# Patient Record
Sex: Male | Born: 1995 | Race: White | Hispanic: No | Marital: Single | Smoking: Current some day smoker
Health system: Southern US, Community
[De-identification: ages and names within clinical notes are randomized; demographics above are authoritative.]

## PROBLEM LIST (undated history)

## (undated) DIAGNOSIS — F909 Attention-deficit hyperactivity disorder, unspecified type: Secondary | ICD-10-CM

## (undated) HISTORY — PX: FRACTURE SURGERY: SHX138

## (undated) HISTORY — PX: TONSILLECTOMY: SUR1361

## (undated) HISTORY — PX: CHOLECYSTECTOMY: SHX55

---
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
## 2018-09-01 ENCOUNTER — Emergency Department
Admission: EM | Admit: 2018-09-01 | Discharge: 2018-09-01 | Disposition: A | Discharge status: Home / Self Care | Payer: Self-pay | Source: Home / Self Care

## 2018-09-01 ENCOUNTER — Encounter: Payer: Self-pay | Admitting: Emergency Medicine

## 2018-09-01 ENCOUNTER — Emergency Department (INDEPENDENT_AMBULATORY_CARE_PROVIDER_SITE_OTHER): Payer: Self-pay

## 2018-09-01 ENCOUNTER — Other Ambulatory Visit: Payer: Self-pay

## 2018-09-01 DIAGNOSIS — J209 Acute bronchitis, unspecified: Secondary | ICD-10-CM

## 2018-09-01 DIAGNOSIS — R05 Cough: Secondary | ICD-10-CM

## 2018-09-01 DIAGNOSIS — J45901 Unspecified asthma with (acute) exacerbation: Secondary | ICD-10-CM

## 2018-09-01 MED ORDER — METHYLPREDNISOLONE ACETATE 80 MG/ML IJ SUSP
80.0000 mg | Freq: Once | INTRAMUSCULAR | Status: AC
  Administered 2018-09-01: 80 mg via INTRAMUSCULAR

## 2018-09-01 MED ORDER — ALBUTEROL SULFATE HFA 108 (90 BASE) MCG/ACT IN AERS: 1.0000 | INHALATION_SPRAY | Freq: Four times a day (QID) | RESPIRATORY_TRACT | 0 refills | Status: AC | PRN

## 2018-09-01 MED ORDER — PREDNISONE 50 MG PO TABS: 50.0000 mg | ORAL_TABLET | Freq: Every day | ORAL | 0 refills | Status: AC

## 2018-09-01 MED ORDER — IPRATROPIUM-ALBUTEROL 0.5-2.5 (3) MG/3ML IN SOLN
3.0000 mL | Freq: Once | RESPIRATORY_TRACT | Status: AC
  Administered 2018-09-01: 3 mL via RESPIRATORY_TRACT

## 2018-09-01 NOTE — ED Triage Notes (Signed)
Patient reports cough with some shortness of breath over past 2 days; no fever, no congestion; states it feels like when he had bronchitis a couple years ago. No OTC in past 4 hours.
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
## 2018-09-01 NOTE — Discharge Instructions (Signed)
°  Please schedule a follow up appointment with family medicine in 1 week if not improving.

## 2019-05-26 IMAGING — DX DG CHEST 2V
2 series · 2 of 2 positions shown · non-contrast
Comparison: 07/19/2016

CLINICAL DATA: Pt c/o cough, congestion and chest tightness x 2
days.

EXAM:
CHEST - 2 VIEW

[chest pa]
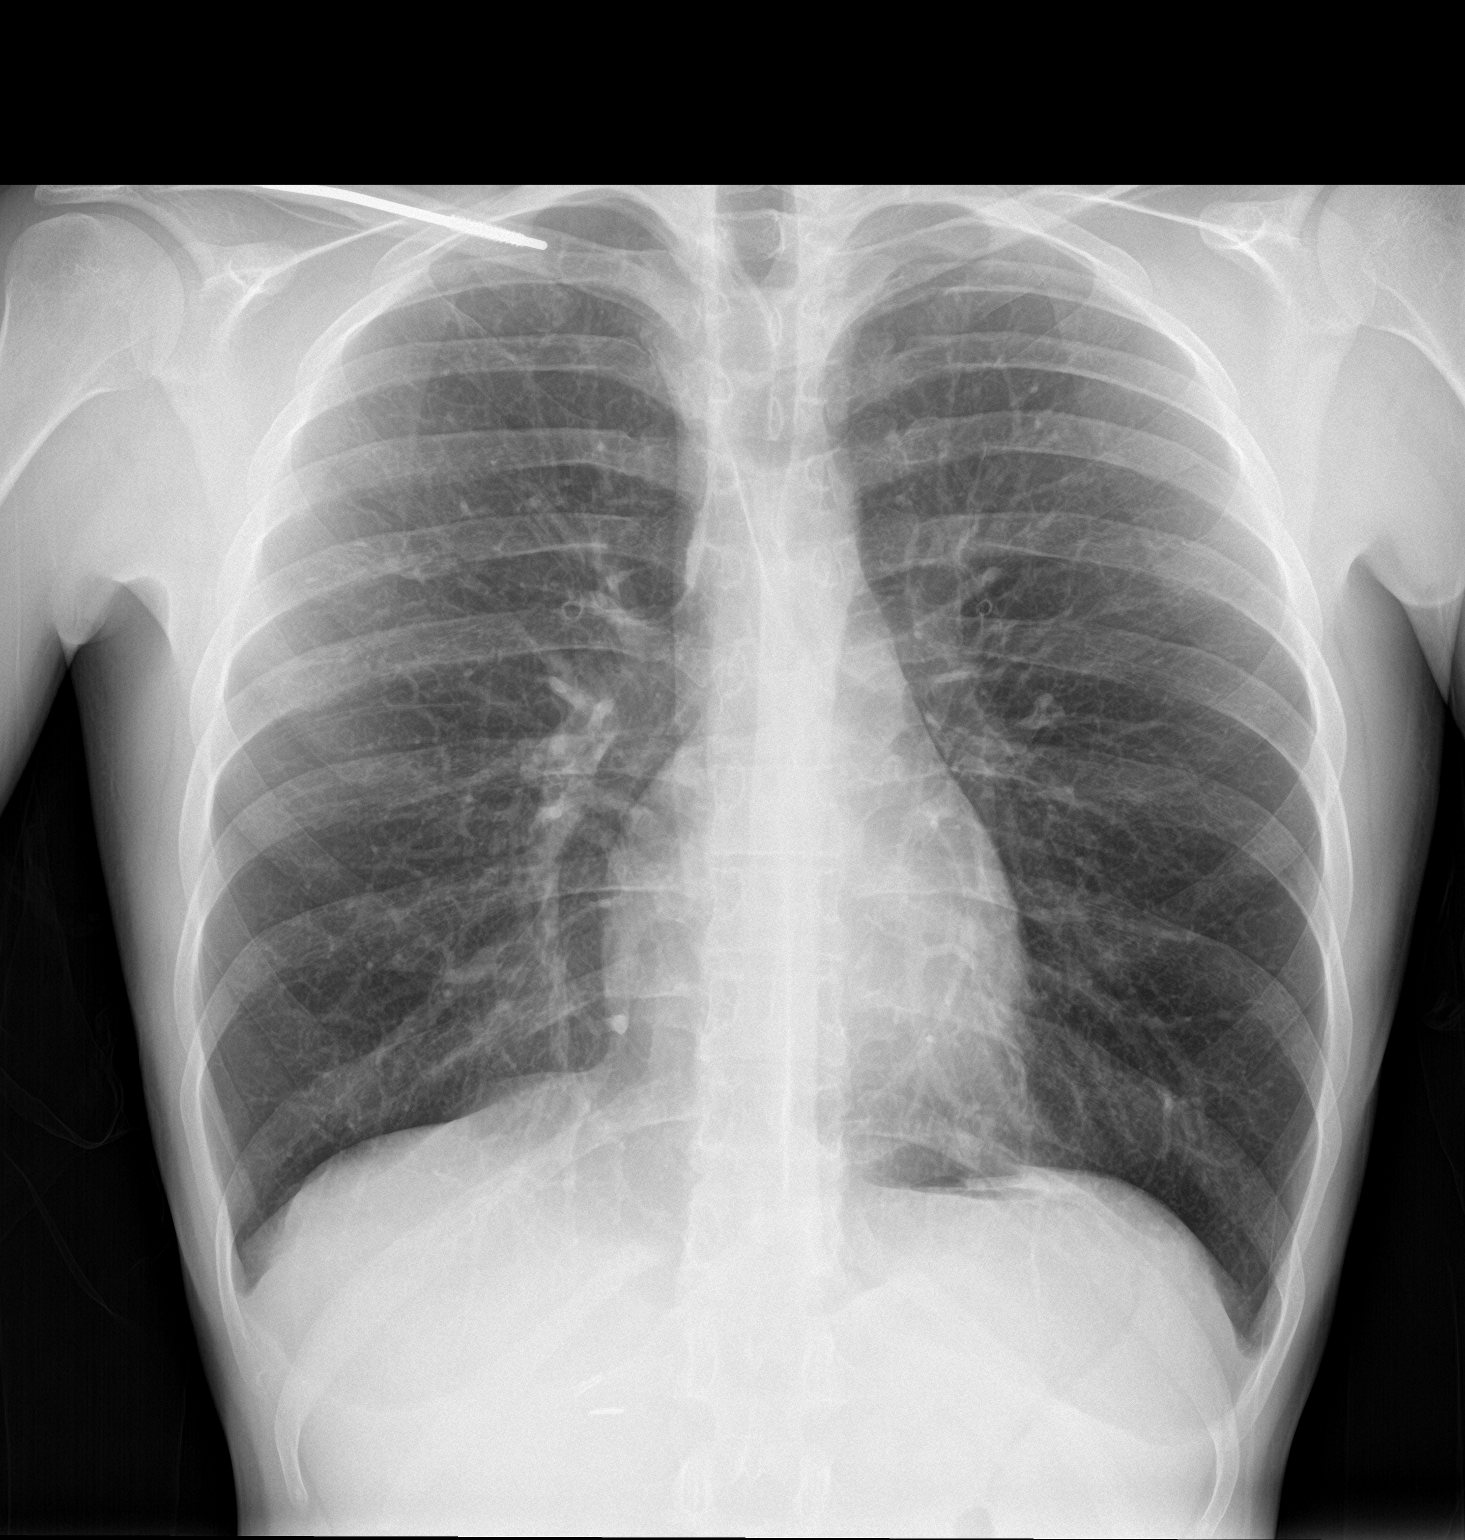

[chest lat]
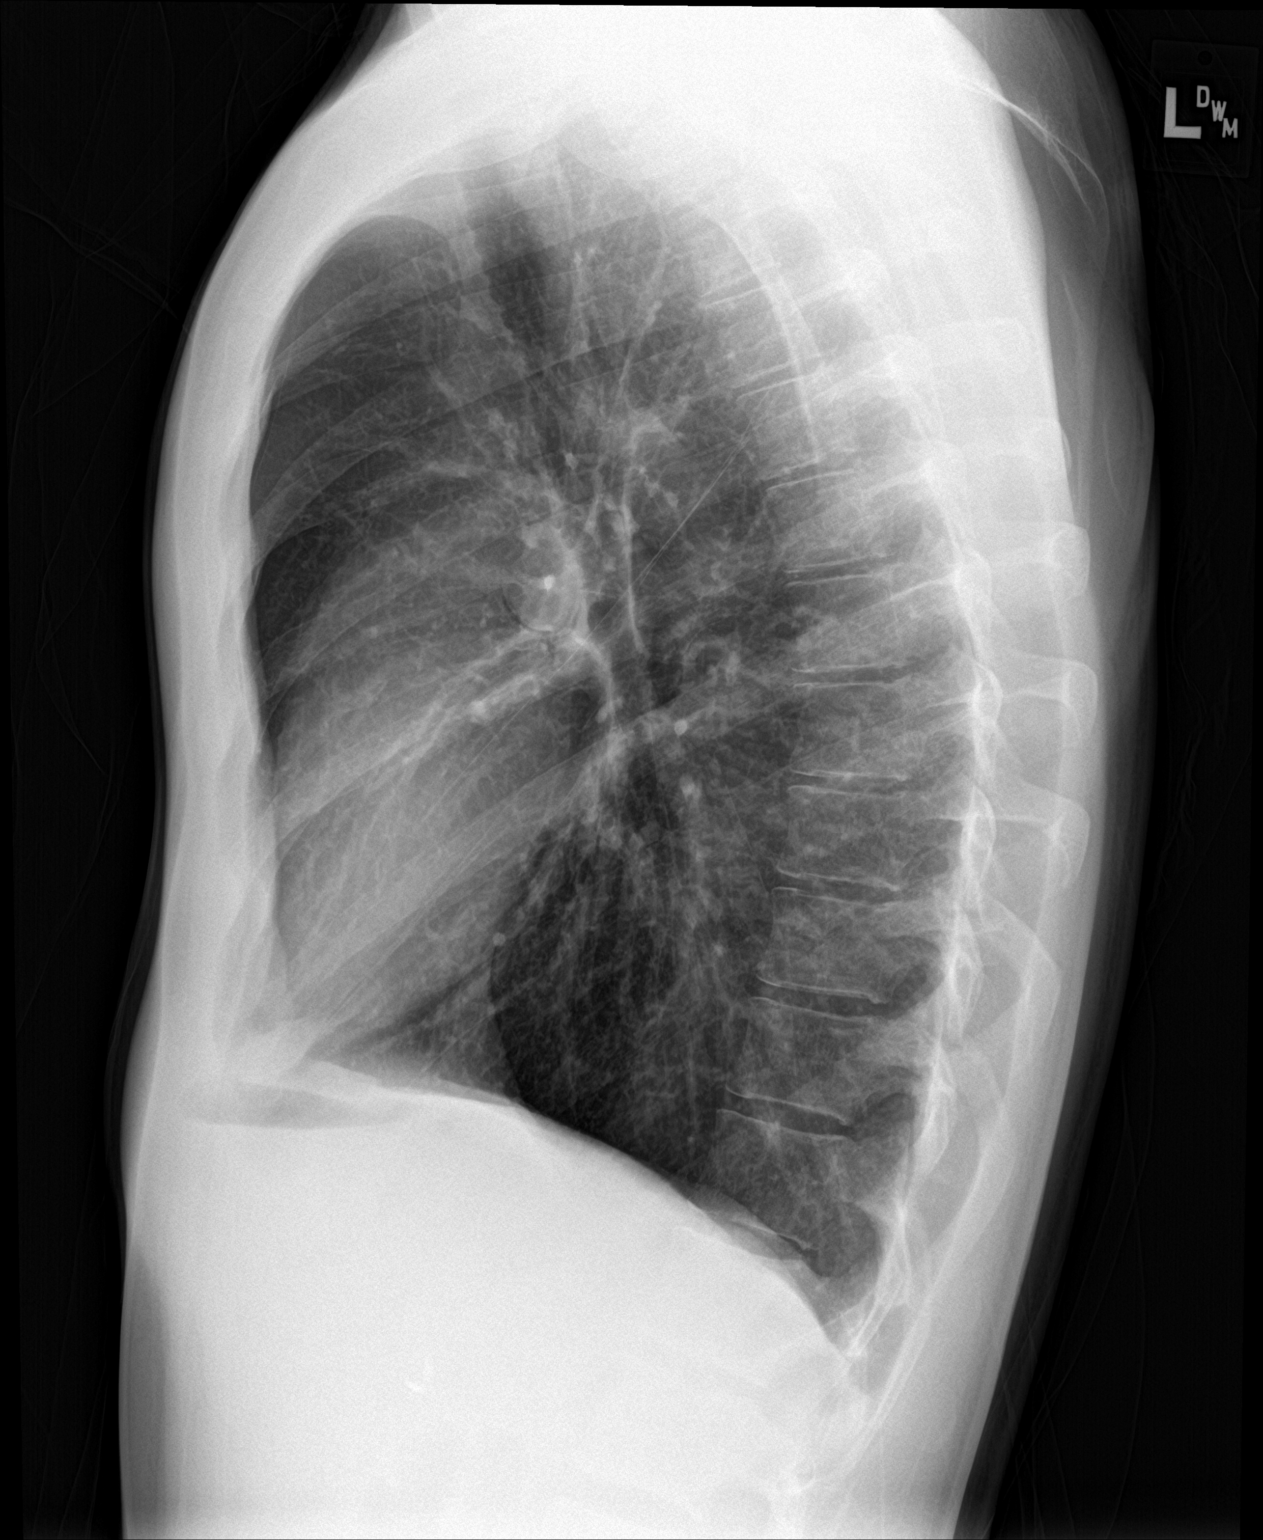

[2 of 2 positions shown; findings below may reference images not displayed]

FINDINGS: Hyperinflation. Cholecystectomy. Right clavicle fixation. Midline
trachea. Normal heart size and mediastinal contours. No pleural
effusion or pneumothorax. Clear lungs. Diffuse peribronchial
thickening.
IMPRESSION: 1. No acute cardiopulmonary disease.
2. Hyperinflation and peribronchial thickening which may relate to
chronic bronchitis or smoking.

## 2021-10-20 ENCOUNTER — Ambulatory Visit
Admission: EM | Admit: 2021-10-20 | Discharge: 2021-10-20 | Disposition: A | Discharge status: Home / Self Care | Payer: 59 | Attending: Emergency Medicine | Admitting: Emergency Medicine

## 2021-10-20 DIAGNOSIS — W5501XA Bitten by cat, initial encounter: Secondary | ICD-10-CM

## 2021-10-20 DIAGNOSIS — F909 Attention-deficit hyperactivity disorder, unspecified type: Secondary | ICD-10-CM | POA: Diagnosis not present

## 2021-10-20 DIAGNOSIS — Z23 Encounter for immunization: Secondary | ICD-10-CM | POA: Diagnosis not present

## 2021-10-20 DIAGNOSIS — S61552A Open bite of left wrist, initial encounter: Secondary | ICD-10-CM | POA: Diagnosis not present

## 2021-10-20 DIAGNOSIS — Z760 Encounter for issue of repeat prescription: Secondary | ICD-10-CM

## 2021-10-20 MED ORDER — ATOMOXETINE HCL 10 MG PO CAPS: 10.0000 mg | ORAL_CAPSULE | Freq: Every day | ORAL | 1 refills | Status: DC

## 2021-10-20 MED ORDER — MOXIFLOXACIN HCL 400 MG PO TABS: 400.0000 mg | ORAL_TABLET | Freq: Every day | ORAL | 0 refills | Status: AC

## 2021-10-20 MED ORDER — TETANUS-DIPHTH-ACELL PERTUSSIS 5-2.5-18.5 LF-MCG/0.5 IM SUSY
0.5000 mL | PREFILLED_SYRINGE | Freq: Once | INTRAMUSCULAR | Status: AC
  Administered 2021-10-20: 0.5 mL via INTRAMUSCULAR

## 2021-10-20 NOTE — ED Provider Notes (Signed)
?[facility] ? ? ? ?CSN: [id number] ?Arrival date & time: 10/20/21  1344 ?  ? ?HISTORY  ? ?Chief Complaint  ?Patient presents with  ? Animal Bite  ? ?HPI ?Cameron Skinner is a 26 y.o. male. Patient states that 2 days ago, he was attempting to prevent his cat from climbing a screen door whereupon the cat turned around scratched his left wrist and bit him deeply on the back of his left hand.  Patient states he cleaned the wounds well after they occurred and decided to monitor them for a day or 2.  Patient states at this time, he has had significant increase in swelling and redness around the puncture wound on the back of his left hand, denies pain and swelling in the wrist, loss of range of motion.  Patient states he does not recall when he last had a tetanus booster but states that his pet cat is fully vaccinated ? ? ?Past Medical History:  ?Diagnosis Date  ? ADHD (attention deficit hyperactivity disorder)   ? ?There are no problems to display for this patient. ? ?Past Surgical History:  ?Procedure Laterality Date  ? CHOLECYSTECTOMY    ? FRACTURE SURGERY    ? rt shoulder fx   2012        broke lt arm  at [age]yrs  ? HARDWARE REMOVAL  08/18/2011  ? Procedure: HARDWARE REMOVAL;  Surgeon: Verlee RossettiSteven R Norris, MD;  [facility] OR;  Service: Orthopedics;  Laterality: Right;  right clavical pin removal  ? TONSILLECTOMY    ? ? ?Home Medications   ? ?Prior to Admission medications   ?Medication Sig Start Date End Date Taking? Authorizing Provider  ?acetaminophen (TYLENOL) 500 MG tablet Take 1,000 mg by mouth every 6 (six) hours as needed. For pain    [provider]  ?albuterol (PROVENTIL HFA;VENTOLIN HFA) 108 (90 Base) MCG/ACT inhaler Inhale 1-2 puffs into the lungs every 6 (six) hours as needed for wheezing or shortness of breath. 09/01/18   Lurene ShadowPhelps, Erin O, PA-C  ? ? ?Family History ?Family History  ?Problem Relation Age of Onset  ? Cancer Maternal Grandfather   ? Diabetes Maternal Grandfather   ? ?Social  History ?Social History  ? ?Tobacco Use  ? Smoking status: Some Days  ? Smokeless tobacco: Never  ?Substance Use Topics  ? Alcohol use: No  ? Drug use: Yes  ?  Types: Marijuana  ? ?Allergies   ?Penicillins ? ?Review of Systems ?Review of Systems ?Pertinent findings noted in history of present illness.  ? ?Physical Exam ?Triage Vital Signs ?ED Triage Vitals  ?Enc Vitals Group  ?   BP 05/06/21 0827 (!) 147/82  ?   Pulse Rate 05/06/21 0827 72  ?   Resp 05/06/21 0827 18  ?   Temp 05/06/21 0827 98.3 ?F (36.8 ?C)  ?   Temp Source 05/06/21 0827 Oral  ?   SpO2 05/06/21 0827 98 %  ?   Weight --   ?   Height --   ?   Head Circumference --   ?   Peak Flow --   ?   Pain Score 05/06/21 0826 5  ?   Pain Loc --   ?   Pain Edu? --   ?   Excl. in GC? --   ?No data found. ? ?Updated Vital Signs ?BP 111/72 (BP Location: Right Arm)   Pulse 72   Temp 98 ?F (36.7 ?C) (Oral)   Resp 18  SpO2 98%  ? ?Physical Exam ?Vitals and nursing note reviewed.  ?Constitutional:   ?   General: He is not in acute distress. ?   Appearance: Normal appearance. He is not ill-appearing.  ?HENT:  ?   Head: Normocephalic and atraumatic.  ?Eyes:  ?   General: Lids are normal.     ?   Right eye: No discharge.     ?   Left eye: No discharge.  ?   Extraocular Movements: Extraocular movements intact.  ?   Conjunctiva/sclera: Conjunctivae normal.  ?   Right eye: Right conjunctiva is not injected.  ?   Left eye: Left conjunctiva is not injected.  ?Neck:  ?   Trachea: Trachea and phonation normal.  ?Cardiovascular:  ?   Rate and Rhythm: Normal rate and regular rhythm.  ?   Pulses: Normal pulses.  ?   Heart sounds: Normal heart sounds. No murmur heard. ?  No friction rub. No gallop.  ?Pulmonary:  ?   Effort: Pulmonary effort is normal. No accessory muscle usage, prolonged expiration or respiratory distress.  ?   Breath sounds: Normal breath sounds. No stridor, decreased air movement or transmitted upper airway sounds. No decreased breath sounds, wheezing, rhonchi  or rales.  ?Chest:  ?   Chest wall: No tenderness.  ?Musculoskeletal:     ?   General: Normal range of motion.  ?   Cervical back: Normal range of motion and neck supple. Normal range of motion.  ?Lymphadenopathy:  ?   Cervical: No cervical adenopathy.  ?Skin: ?   General: Skin is warm and dry.  ?   Findings: Erythema (Swelling around central punctate that is nonfluctuant on back of left hand between second metacarpal) present. No rash.  ?Neurological:  ?   General: No focal deficit present.  ?   Mental Status: He is alert and oriented to person, place, and time.  ?Psychiatric:     ?   Mood and Affect: Mood normal.     ?   Behavior: Behavior normal.  ? ? ?Visual Acuity ?Right Eye Distance:   ?Left Eye Distance:   ?Bilateral Distance:   ? ?Right Eye Near:   ?Left Eye Near:    ?Bilateral Near:    ? ?UC Couse / Diagnostics / Procedures:  ?  ?EKG ? ?Radiology ?No results found. ? ?Procedures ?Procedures (including critical care time) ? ?UC Diagnoses / Final Clinical Impressions(s)   ?I have reviewed the triage vital signs and the nursing notes. ? ?Pertinent labs & imaging results that were available during my care of the patient were reviewed by me and considered in my medical decision making (see chart for details).   ? ?Final diagnoses:  ?Cat bite of left wrist, initial encounter  ?Adult ADHD  ?Medication refill  ? ?Patient reports hives when taking penicillin, patient provided with a prescription for moxifloxacin and a tetanus booster today.  Emergency precautions advised.  Return precautions advised. ? ?ED Prescriptions   ? ? Medication Sig Dispense Auth. Provider  ? atomoxetine (STRATTERA) 10 MG capsule Take 1 capsule (10 mg total) by mouth daily. 30 capsule Theadora Rama Scales, PA-C  ? moxifloxacin (AVELOX) 400 MG tablet Take 1 tablet (400 mg total) by mouth daily at 8 pm for 5 days. 5 tablet Theadora Rama Scales, PA-C  ? ?  ? ?PDMP not reviewed this encounter. ? ?Pending results:  ?Labs Reviewed - No data to  display ? ?Medications Ordered in UC: ?Medications  ?Tdap (BOOSTRIX)  injection 0.5 mL (0.5 mLs Intramuscular Given 10/20/21 1418)  ? ? ?Disposition Upon Discharge:  ?Condition: stable for discharge home ?Home: take medications as prescribed; routine discharge instructions as discussed; follow up as advised. ? ?Patient presented with an acute illness with associated systemic symptoms and significant discomfort requiring urgent management. In my opinion, this is a condition that a prudent lay person (someone who possesses an average knowledge of health and medicine) may potentially expect to result in complications if not addressed urgently such as respiratory distress, impairment of bodily function or dysfunction of bodily organs.  ? ?Routine symptom specific, illness specific and/or disease specific instructions were discussed with the patient and/or caregiver at length.  ? ?As such, the patient has been evaluated and assessed, work-up was performed and treatment was provided in alignment with urgent care protocols and evidence based medicine.  Patient/parent/caregiver has been advised that the patient may require follow up for further testing and treatment if the symptoms continue in spite of treatment, as clinically indicated and appropriate. ? ?Patient/parent/caregiver has been advised to return to the [facility] or PCP if no better; to PCP or the Emergency Department if new signs and symptoms develop, or if the current signs or symptoms continue to change or worsen for further workup, evaluation and treatment as clinically indicated and appropriate ? ?The patient will follow up with their current PCP if and as advised. If the patient does not currently have a PCP we will assist them in obtaining one.  ? ?The patient may need specialty follow up if the symptoms continue, in spite of conservative treatment and management, for further workup, evaluation, consultation and treatment as clinically indicated and  appropriate. ? ? ?Patient/parent/caregiver verbalized understanding and agreement of plan as discussed.  All questions were addressed during visit.  Please see discharge instructions below for further details of plan. ? ?Judi Cong

## 2021-10-20 NOTE — ED Triage Notes (Addendum)
Pt c/o cat puncture wound and scratches to his left hand (pt states this is his cat, it is up to date on shots).  ?Happened: Tuesday ?

## 2021-10-20 NOTE — Discharge Instructions (Addendum)
For the cat bite in your left hand, and because you are allergic to penicillin, please begin moxifloxacin 400 mg once daily for the next 5 days.  If you do not find that the wound is improved after several days of moxifloxacin or if the wound appears to be getting worse despite taking moxifloxacin, please report to the emergency room for further evaluation. ? ?I am happy to renew your Strattera for you, I provided you with 30 days with 1 refill. ? ?Thank you for visiting urgent care today.  We appreciate the opportunity to participate in your care. ?

## 2021-10-24 ENCOUNTER — Telehealth: Payer: Self-pay

## 2021-10-24 ENCOUNTER — Telehealth: Payer: Self-pay | Admitting: Emergency Medicine

## 2021-10-24 DIAGNOSIS — Z760 Encounter for issue of repeat prescription: Secondary | ICD-10-CM

## 2021-10-24 DIAGNOSIS — F909 Attention-deficit hyperactivity disorder, unspecified type: Secondary | ICD-10-CM

## 2021-10-24 MED ORDER — ATOMOXETINE HCL 10 MG PO CAPS: 10.0000 mg | ORAL_CAPSULE | Freq: Every day | ORAL | 1 refills | Status: DC

## 2021-10-24 NOTE — Telephone Encounter (Signed)
Pt called urgent care requesting to have Strattera prescription sent to another pharmacy. Provider made aware, paper prescription created and patient made aware. Patient states he will be here to pick up the script. ?

## 2021-10-24 NOTE — Telephone Encounter (Signed)
Strattera sent to second pharmacy.  Prescription printed and provided to patient. ?

## 2021-11-02 ENCOUNTER — Telehealth: Payer: Self-pay | Admitting: Emergency Medicine

## 2021-11-02 DIAGNOSIS — Z760 Encounter for issue of repeat prescription: Secondary | ICD-10-CM

## 2021-11-02 DIAGNOSIS — F909 Attention-deficit hyperactivity disorder, unspecified type: Secondary | ICD-10-CM

## 2021-11-02 MED ORDER — ATOMOXETINE HCL 10 MG PO CAPS: 10.0000 mg | ORAL_CAPSULE | Freq: Every day | ORAL | 1 refills | Status: AC

## 2021-11-02 NOTE — Telephone Encounter (Signed)
Pt requesting reprint ? ?
